# Patient Record
Sex: Male | Born: 1990 | Race: Black or African American | Hispanic: No | Marital: Single | Smoking: Current every day smoker
Health system: Southern US, Community
[De-identification: ages and names within clinical notes are randomized; demographics above are authoritative.]

## PROBLEM LIST (undated history)

## (undated) DIAGNOSIS — J45909 Unspecified asthma, uncomplicated: Secondary | ICD-10-CM

---
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
## 2015-03-25 ENCOUNTER — Encounter (HOSPITAL_COMMUNITY): Payer: Self-pay | Admitting: Emergency Medicine

## 2015-03-25 ENCOUNTER — Emergency Department (HOSPITAL_COMMUNITY)
Admission: EM | Admit: 2015-03-25 | Discharge: 2015-03-25 | Disposition: A | Discharge status: Home / Self Care | Payer: Self-pay | Attending: Emergency Medicine | Admitting: Emergency Medicine

## 2015-03-25 DIAGNOSIS — W228XXA Striking against or struck by other objects, initial encounter: Secondary | ICD-10-CM | POA: Insufficient documentation

## 2015-03-25 DIAGNOSIS — Y998 Other external cause status: Secondary | ICD-10-CM | POA: Insufficient documentation

## 2015-03-25 DIAGNOSIS — Y9289 Other specified places as the place of occurrence of the external cause: Secondary | ICD-10-CM | POA: Insufficient documentation

## 2015-03-25 DIAGNOSIS — Y9389 Activity, other specified: Secondary | ICD-10-CM | POA: Insufficient documentation

## 2015-03-25 DIAGNOSIS — Z720 Tobacco use: Secondary | ICD-10-CM | POA: Insufficient documentation

## 2015-03-25 DIAGNOSIS — S0502XA Injury of conjunctiva and corneal abrasion without foreign body, left eye, initial encounter: Secondary | ICD-10-CM | POA: Insufficient documentation

## 2015-03-25 MED ORDER — ERYTHROMYCIN 5 MG/GM OP OINT
TOPICAL_OINTMENT | Freq: Once | OPHTHALMIC | Status: AC
  Administered 2015-03-25: 1 via OPHTHALMIC
  Filled 2015-03-25: qty 3.5

## 2015-03-25 MED ORDER — TETRACAINE HCL 0.5 % OP SOLN
OPHTHALMIC | Status: AC
  Administered 2015-03-25: 22:00:00
  Filled 2015-03-25: qty 2

## 2015-03-25 MED ORDER — FLUORESCEIN SODIUM 1 MG OP STRP
ORAL_STRIP | OPHTHALMIC | Status: AC
  Administered 2015-03-25: 1
  Filled 2015-03-25: qty 1

## 2015-03-25 NOTE — ED Notes (Signed)
Patient states he scratched his eye with a ziptie around 1930.

## 2015-03-25 NOTE — Discharge Instructions (Signed)
Corneal Abrasion °The cornea is the clear covering at the front and center of the eye. When looking at the colored portion of the eye (iris), you are looking through the cornea. This very thin tissue is made up of many layers. The surface layer is a single layer of cells (corneal epithelium) and is one of the most sensitive tissues in the body. If a scratch or injury causes the corneal epithelium to come off, it is called a corneal abrasion. If the injury extends to the tissues below the epithelium, the condition is called a corneal ulcer. °CAUSES  °· Scratches. °· Trauma. °· Foreign body in the eye. °Some people have recurrences of abrasions in the area of the original injury even after it has healed (recurrent erosion syndrome). Recurrent erosion syndrome generally improves and goes away with time. °SYMPTOMS  °· Eye pain. °· Difficulty or inability to keep the injured eye open. °· The eye becomes very sensitive to light. °· Recurrent erosions tend to happen suddenly, first thing in the morning, usually after waking up and opening the eye. °DIAGNOSIS  °Your health care provider can diagnose a corneal abrasion during an eye exam. Dye is usually placed in the eye using a drop or a small paper strip moistened by your tears. When the eye is examined with a special light, the abrasion shows up clearly because of the dye. °TREATMENT  °· Small abrasions may be treated with antibiotic drops or ointment alone. °· A pressure patch may be put over the eye. If this is done, follow your doctor's instructions for when to remove the patch. Do not drive or use machines while the eye patch is on. Judging distances is hard to do with a patch on. °If the abrasion becomes infected and spreads to the deeper tissues of the cornea, a corneal ulcer can result. This is serious because it can cause corneal scarring. Corneal scars interfere with light passing through the cornea and cause a loss of vision in the involved eye. °HOME CARE  INSTRUCTIONS °· Use medicine or ointment as directed. Only take over-the-counter or prescription medicines for pain, discomfort, or fever as directed by your health care provider. °· Do not drive or operate machinery if your eye is patched. Your ability to judge distances is impaired. °· If your health care provider has given you a follow-up appointment, it is very important to keep that appointment. Not keeping the appointment could result in a severe eye infection or permanent loss of vision. If there is any problem keeping the appointment, let your health care provider know. °SEEK MEDICAL CARE IF:  °· You have pain, light sensitivity, and a scratchy feeling in one eye or both eyes. °· Your pressure patch keeps loosening up, and you can blink your eye under the patch after treatment. °· Any kind of discharge develops from the eye after treatment or if the lids stick together in the morning. °· You have the same symptoms in the morning as you did with the original abrasion days, weeks, or months after the abrasion healed. °MAKE SURE YOU:  °· Understand these instructions. °· Will watch your condition. °· Will get help right away if you are not doing well or get worse. °Document Released: 12/08/2000 Document Revised: 12/16/2013 Document Reviewed: 08/18/2013 °[employer]® Patient Information ©2015 [employer]. This information is not intended to replace advice given to you by your health care provider. Make sure you discuss any questions you have with your health care provider. ° °

## 2015-03-26 NOTE — ED Provider Notes (Signed)
CSN: [id number]     Arrival date & time 03/25/15  2125 History   First MD Initiated Contact with Patient 03/25/15 2147     Chief Complaint  Patient presents with  . Eye Injury     (Consider location/radiation/quality/duration/timing/severity/associated sxs/prior Treatment) The history is provided by the patient.   Tyler Cooper is a 24 y.o. male presenting with a painful left eye after injuring it with a scratch from a zip tie prior to arrival.  He denies visual changes, except for the tearing and difficulty keeping the eye open secondary to pain.  He flushed the eye prior to arrival without relief.  He does not wear glasses or contact lenses.  He is utd with his tetanus.     History reviewed. No pertinent past medical history. History reviewed. No pertinent past surgical history. No family history on file. History  Substance Use Topics  . Smoking status: Current Every Day Smoker  . Smokeless tobacco: Not on file  . Alcohol Use: Yes    Review of Systems  Constitutional: Negative.   HENT: Negative.   Eyes: Positive for pain. Negative for discharge and visual disturbance.  Respiratory: Negative.   Cardiovascular: Negative.   Gastrointestinal: Negative.       Allergies  Review of patient's allergies indicates no known allergies.  Home Medications   Prior to Admission medications   Not on File   BP 107/90 mmHg  Pulse 66  Temp(Src) 98.5 F (36.9 C) (Oral)  Resp 20  Ht 6\' 2"  (1.88 m)  Wt 185 lb (83.915 kg)  BMI 23.74 kg/m2  SpO2 100% Physical Exam  Constitutional: He appears well-developed and well-nourished.  HENT:  Head: Normocephalic and atraumatic.  Eyes: EOM are normal. Pupils are equal, round, and reactive to light. Left conjunctiva is injected.  Slit lamp exam:      The left eye shows corneal abrasion and fluorescein uptake. The left eye shows no foreign body and no anterior chamber bulge.  Small narrow abrasion middle of left cornea, approx 0.25 mm of  dye uptake.  Visual Acuity - Bilateral Near: 20/10 ; R Near: 20/13 ; L Near: 20/20  Neck: Normal range of motion.  Cardiovascular: Normal rate.   Pulmonary/Chest: Effort normal.  Neurological: He is alert.  Skin: Skin is warm and dry.  Psychiatric: He has a normal mood and affect.  Nursing note and vitals reviewed.   ED Course  Procedures (including critical care time) Labs Review Labs Reviewed - No data to display  Imaging Review No results found.   EKG Interpretation None      MDM   Final diagnoses:  Corneal abrasion, left, initial encounter    Pt given erythromycin ointment, first dose given here. Referral to Dr. Lita MainsHaines for a recheck if sx persist beyond the next 3 days, expect this small abrasion should be much improved by that time.      Burgess AmorJulie Teandre Hamre, PA-C 03/26/15 1426  Bethann BerkshireJoseph Zammit, MD 03/28/15 [phone]

## 2016-05-18 ENCOUNTER — Emergency Department (HOSPITAL_COMMUNITY): Payer: Self-pay

## 2016-05-18 ENCOUNTER — Encounter (HOSPITAL_COMMUNITY): Payer: Self-pay | Admitting: Emergency Medicine

## 2016-05-18 ENCOUNTER — Emergency Department (HOSPITAL_COMMUNITY)
Admission: EM | Admit: 2016-05-18 | Discharge: 2016-05-18 | Disposition: A | Discharge status: Home / Self Care | Payer: Self-pay | Attending: Emergency Medicine | Admitting: Emergency Medicine

## 2016-05-18 DIAGNOSIS — Z720 Tobacco use: Secondary | ICD-10-CM

## 2016-05-18 DIAGNOSIS — J069 Acute upper respiratory infection, unspecified: Secondary | ICD-10-CM | POA: Insufficient documentation

## 2016-05-18 DIAGNOSIS — J9801 Acute bronchospasm: Secondary | ICD-10-CM | POA: Insufficient documentation

## 2016-05-18 DIAGNOSIS — F17210 Nicotine dependence, cigarettes, uncomplicated: Secondary | ICD-10-CM | POA: Insufficient documentation

## 2016-05-18 HISTORY — DX: Unspecified asthma, uncomplicated: J45.909

## 2016-05-18 MED ORDER — GUAIFENESIN-CODEINE 100-10 MG/5ML PO SOLN: 5.0000 mL | Freq: Four times a day (QID) | ORAL | Status: DC | PRN

## 2016-05-18 MED ORDER — ALBUTEROL SULFATE HFA 108 (90 BASE) MCG/ACT IN AERS
2.0000 | INHALATION_SPRAY | Freq: Once | RESPIRATORY_TRACT | Status: AC
  Administered 2016-05-18: 2 via RESPIRATORY_TRACT
  Filled 2016-05-18: qty 6.7

## 2016-05-18 MED ORDER — PREDNISONE 20 MG PO TABS: 40.0000 mg | ORAL_TABLET | Freq: Every day | ORAL | Status: DC

## 2016-05-18 MED ORDER — AEROCHAMBER PLUS W/MASK MISC
1.0000 | Freq: Once | Status: DC
  Filled 2016-05-18: qty 1

## 2016-05-18 NOTE — ED Provider Notes (Signed)
CSN: [id number]     Arrival date & time 05/18/16  4098 History   None    Chief Complaint  Patient presents with  . Cough     (Consider location/radiation/quality/duration/timing/severity/associated sxs/prior Treatment) HPI This is a 25 year old male who presents emergency Department with chief complaint of cough, nasal congestion. The patient is a current daily smoker of both marijuana and cigarettes. He denies any history of asthma. The patient states that he developed chest congestion, cough. He has been using Mucinex with moderate relief of symptoms. He is having worse cough at night and has had difficulty sleeping. He has not taken any sleep aids or nighttime cough medicine. He denies fevers, chills, bodyaches. He has a negative chest x-ray. Upon initial evaluation Past Medical History  Diagnosis Date  . Asthma    History reviewed. No pertinent past surgical history. History reviewed. No pertinent family history. Social History  Substance Use Topics  . Smoking status: Current Every Day Smoker -- 1.00 packs/day    Types: Cigarettes  . Smokeless tobacco: None  . Alcohol Use: Yes    Review of Systems  Ten systems reviewed and are negative for acute change, except as noted in the HPI.    Allergies  Review of patient's allergies indicates no known allergies.  Home Medications   Prior to Admission medications   Not on File   BP 111/76 mmHg  Pulse 65  Temp(Src) 98.2 F (36.8 C) (Oral)  Resp 16  Ht  (1.905 m)  Wt 81.194 kg  BMI 22.37 kg/m2  SpO2 100% Physical Exam Physical Exam  Nursing note and vitals reviewed. Constitutional: He appears well-developed and well-nourished. No distress.  HENT:  Head: Normocephalic and atraumatic.  Eyes: Conjunctivae normal are normal. No scleral icterus.  Neck: Normal range of motion. Neck supple.  Cardiovascular: Normal rate, regular rhythm and normal heart sounds.   Pulmonary/Chest: Effort normal. No respiratory distress.   tight cough with minimal expiratory wheeze. Abdominal: Soft. There is no tenderness.  Musculoskeletal: He exhibits no edema.  Neurological: He is alert.  Skin: Skin is warm and dry. He is not diaphoretic.  Psychiatric: His behavior is normal.    ED Course  Procedures (including critical care time) Labs Review Labs Reviewed - No data to display  Imaging Review Dg Chest 2 View  05/18/2016  CLINICAL DATA:  Productive cough and congestion for 3 days, smoker, asthma, initial encounter EXAM: CHEST  2 VIEW COMPARISON:  None FINDINGS: Normal heart size, mediastinal contours, and pulmonary vascularity. Minimal peribronchial thickening and hyperinflation consistent with history of asthma. No acute infiltrate, pleural effusion, or pneumothorax. Bones unremarkable. IMPRESSION: Minimal peribronchial thickening and hyperinflation consistent with history of asthma. No acute infiltrate. Electronically Signed   By: Ulyses Southward M.D.   On: 05/18/2016 10:04   I have personally reviewed and evaluated these images and lab results as part of my medical decision-making.   EKG Interpretation None      MDM   Final diagnoses:  URI, acute  Bronchospasm  Tobacco abuse    Pt CXR negative for acute infiltrate. Patients symptoms are consistent with URI, likely viral etiology. Discussed that antibiotics are not indicated for viral infections. Pt will be discharged with symptomatic treatment.  Verbalizes understanding and is agreeable with plan. Pt is hemodynamically stable & in NAD prior to dc. The patient was counseled on the dangers of tobacco use, and was advised to quit.  Reviewed strategies to maximize success, including removing cigarettes and smoking materials  from environment, stress management, substitution of other forms of reinforcement, support of family/friends and written materials.     Arthor Captainbigail Tammy Ericsson, PA-C 05/18/16 1228  Donnetta HutchingBrian Cook, MD 05/18/16 1325

## 2016-05-18 NOTE — Discharge Instructions (Signed)
Bronchospasm, Adult A bronchospasm is a spasm or tightening of the airways going into the lungs. During a bronchospasm breathing becomes more difficult because the airways get smaller. When this happens there can be coughing, a whistling sound when breathing (wheezing), and difficulty breathing. Bronchospasm is often associated with asthma, but not all patients who experience a bronchospasm have asthma. CAUSES  A bronchospasm is caused by inflammation or irritation of the airways. The inflammation or irritation may be triggered by:   Allergies (such as to animals, pollen, food, or mold). Allergens that cause bronchospasm may cause wheezing immediately after exposure or many hours later.   Infection. Viral infections are believed to be the most common cause of bronchospasm.   Exercise.   Irritants (such as pollution, cigarette smoke, strong odors, aerosol sprays, and paint fumes).   Weather changes. Winds increase molds and pollens in the air. Rain refreshes the air by washing irritants out. Cold air may cause inflammation.   Stress and emotional upset.  SIGNS AND SYMPTOMS   Wheezing.   Excessive nighttime coughing.   Frequent or severe coughing with a simple cold.   Chest tightness.   Shortness of breath.  DIAGNOSIS  Bronchospasm is usually diagnosed through a history and physical exam. Tests, such as chest X-rays, are sometimes done to look for other conditions. TREATMENT   Inhaled medicines can be given to open up your airways and help you breathe. The medicines can be given using either an inhaler or a nebulizer machine.  Corticosteroid medicines may be given for severe bronchospasm, usually when it is associated with asthma. HOME CARE INSTRUCTIONS   Always have a plan prepared for seeking medical care. Know when to call your health care provider and local emergency services (911 in the [place] Know where you can access local emergency care.  Only take medicines as  directed by your health care provider.  If you were prescribed an inhaler or nebulizer machine, ask your health care provider to explain how to use it correctly. Always use a spacer with your inhaler if you were given one.  It is necessary to remain calm during an attack. Try to relax and breathe more slowly.  Control your home environment in the following ways:   Change your heating and air conditioning filter at least once a month.   Limit your use of fireplaces and wood stoves.  Do not smoke and do not allow smoking in your home.   Avoid exposure to perfumes and fragrances.   Get rid of pests (such as roaches and mice) and their droppings.   Throw away plants if you see mold on them.   Keep your house clean and dust free.   Replace carpet with wood, tile, or vinyl flooring. Carpet can trap dander and dust.   Use allergy-proof pillows, mattress covers, and box spring covers.   Wash bed sheets and blankets every week in hot water and dry them in a dryer.   Use blankets that are made of polyester or cotton.   Wash hands frequently. SEEK MEDICAL CARE IF:   You have muscle aches.   You have chest pain.   The sputum changes from clear or white to yellow, green, gray, or bloody.   The sputum you cough up gets thicker.   There are problems that may be related to the medicine you are given, such as a rash, itching, swelling, or trouble breathing.  SEEK IMMEDIATE MEDICAL CARE IF:   You have worsening wheezing and coughing  even after taking your prescribed medicines.   You have increased difficulty breathing.   You develop severe chest pain. MAKE SURE YOU:   Understand these instructions.  Will watch your condition.  Will get help right away if you are not doing well or get worse.   This information is not intended to replace advice given to you by your health care provider. Make sure you discuss any questions you have with your health care  provider.   Document Released: 12/14/2003 Document Revised: 01/01/2015 Document Reviewed: 06/02/2013 Elsevier Interactive Patient Education 2016 [employer]  Steps to Quit Smoking  Smoking tobacco can be harmful to your health and can affect almost every organ in your body. Smoking puts you, and those around you, at risk for developing many serious chronic diseases. Quitting smoking is difficult, but it is one of the best things that you can do for your health. It is never too late to quit. WHAT ARE THE BENEFITS OF QUITTING SMOKING? When you quit smoking, you lower your risk of developing serious diseases and conditions, such as:  Lung cancer or lung disease, such as COPD.  Heart disease.  Stroke.  Heart attack.  Infertility.  Osteoporosis and bone fractures. Additionally, symptoms such as coughing, wheezing, and shortness of breath may get better when you quit. You may also find that you get sick less often because your body is stronger at fighting off colds and infections. If you are pregnant, quitting smoking can help to reduce your chances of having a baby of low birth weight. HOW DO I GET READY TO QUIT? When you decide to quit smoking, create a plan to make sure that you are successful. Before you quit:  Pick a date to quit. Set a date within the next two weeks to give you time to prepare.  Write down the reasons why you are quitting. Keep this list in places where you will see it often, such as on your bathroom mirror or in your car or wallet.  Identify the people, places, things, and activities that make you want to smoke (triggers) and avoid them. Make sure to take these actions:  Throw away all cigarettes at home, at work, and in your car.  Throw away smoking accessories, such as Set designerashtrays and lighters.  Clean your car and make sure to empty the ashtray.  Clean your home, including curtains and carpets.  Tell your family, friends, and coworkers that you are quitting.  Support from your loved ones can make quitting easier.  Talk with your health care provider about your options for quitting smoking.  Find out what treatment options are covered by your health insurance. WHAT STRATEGIES CAN I USE TO QUIT SMOKING?  Talk with your healthcare provider about different strategies to quit smoking. Some strategies include:  Quitting smoking altogether instead of gradually lessening how much you smoke over a period of time. Research shows that quitting "cold [place]" is more successful than gradually quitting.  Attending in-person counseling to help you build problem-solving skills. You are more likely to have success in quitting if you attend several counseling sessions. Even short sessions of 10 minutes can be effective.  Finding resources and support systems that can help you to quit smoking and remain smoke-free after you quit. These resources are most helpful when you use them often. They can include:  Online chats with a Veterinary surgeoncounselor.  Telephone quitlines.  Printed Materials engineerself-help materials.  Support groups or group counseling.  Text messaging programs.  Mobile phone  applications.  Taking medicines to help you quit smoking. (If you are pregnant or breastfeeding, talk with your health care provider first.) Some medicines contain nicotine and some do not. Both types of medicines help with cravings, but the medicines that include nicotine help to relieve withdrawal symptoms. Your health care provider may recommend:  Nicotine patches, gum, or lozenges.  Nicotine inhalers or sprays.  Non-nicotine medicine that is taken by mouth. Talk with your health care provider about combining strategies, such as taking medicines while you are also receiving in-person counseling. Using these two strategies together makes you more likely to succeed in quitting than if you used either strategy on its own. If you are pregnant or breastfeeding, talk with your health care provider  about finding counseling or other support strategies to quit smoking. Do not take medicine to help you quit smoking unless told to do so by your health care provider. WHAT THINGS CAN I DO TO MAKE IT EASIER TO QUIT? Quitting smoking might feel overwhelming at first, but there is a lot that you can do to make it easier. Take these important actions:  Reach out to your family and friends and ask that they support and encourage you during this time. Call telephone quitlines, reach out to support groups, or work with a counselor for support.  Ask people who smoke to avoid smoking around you.  Avoid places that trigger you to smoke, such as bars, parties, or smoke-break areas at work.  Spend time around people who do not smoke.  Lessen stress in your life, because stress can be a smoking trigger for some people. To lessen stress, try:  Exercising regularly.  Deep-breathing exercises.  Yoga.  Meditating.  Performing a body scan. This involves closing your eyes, scanning your body from head to toe, and noticing which parts of your body are particularly tense. Purposefully relax the muscles in those areas.  Download or purchase mobile phone or tablet apps (applications) that can help you stick to your quit plan by providing reminders, tips, and encouragement. There are many free apps, such as QuitGuide from the [employer] developer for Disease Control and Prevention). You can find other support for quitting smoking (smoking cessation) through smokefree.gov and other websites. HOW WILL I FEEL WHEN I QUIT SMOKING? Within the first 24 hours of quitting smoking, you may start to feel some withdrawal symptoms. These symptoms are usually most noticeable 2-3 days after quitting, but they usually do not last beyond 2-3 weeks. Changes or symptoms that you might experience include:  Mood swings.  Restlessness, anxiety, or irritation.  Difficulty concentrating.  Dizziness.  Strong cravings for sugary foods  in addition to nicotine.  Mild weight gain.  Constipation.  Nausea.  Coughing or a sore throat.  Changes in how your medicines work in your body.  A depressed mood.  Difficulty sleeping (insomnia). After the first 2-3 weeks of quitting, you may start to notice more positive results, such as:  Improved sense of smell and taste.  Decreased coughing and sore throat.  Slower heart rate.  Lower blood pressure.  Clearer skin.  The ability to breathe more easily.  Fewer sick days. Quitting smoking is very challenging for most people. Do not get discouraged if you are not successful the first time. Some people need to make many attempts to quit before they achieve long-term success. Do your best to stick to your quit plan, and talk with your health care provider if you have any questions or concerns.  This information is not intended to replace advice given to you by your health care provider. Make sure you discuss any questions you have with your health care provider.   Document Released: 12/05/2001 Document Revised: 04/27/2015 Document Reviewed: 04/27/2015 Elsevier Interactive Patient Education 2016 Elsevier Inc.  Upper Respiratory Infection, Adult Most upper respiratory infections (URIs) are a viral infection of the air passages leading to the lungs. A URI affects the nose, throat, and upper air passages. The most common type of URI is nasopharyngitis and is typically referred to as "the common cold." URIs run their course and usually go away on their own. Most of the time, a URI does not require medical attention, but sometimes a bacterial infection in the upper airways can follow a viral infection. This is called a secondary infection. Sinus and middle ear infections are common types of secondary upper respiratory infections. Bacterial pneumonia can also complicate a URI. A URI can worsen asthma and chronic obstructive pulmonary disease (COPD). Sometimes, these complications  can require emergency medical care and may be life threatening.  CAUSES Almost all URIs are caused by viruses. A virus is a type of germ and can spread from one person to another.  RISKS FACTORS You may be at risk for a URI if:   You smoke.   You have chronic heart or lung disease.  You have a weakened defense (immune) system.   You are very young or very old.   You have nasal allergies or asthma.  You work in crowded or poorly ventilated areas.  You work in health care facilities or schools. SIGNS AND SYMPTOMS  Symptoms typically develop 2-3 days after you come in contact with a cold virus. Most viral URIs last 7-10 days. However, viral URIs from the influenza virus (flu virus) can last 14-18 days and are typically more severe. Symptoms may include:   Runny or stuffy (congested) nose.   Sneezing.   Cough.   Sore throat.   Headache.   Fatigue.   Fever.   Loss of appetite.   Pain in your forehead, behind your eyes, and over your cheekbones (sinus pain).  Muscle aches.  DIAGNOSIS  Your health care provider may diagnose a URI by:  Physical exam.  Tests to check that your symptoms are not due to another condition such as:  Strep throat.  Sinusitis.  Pneumonia.  Asthma. TREATMENT  A URI goes away on its own with time. It cannot be cured with medicines, but medicines may be prescribed or recommended to relieve symptoms. Medicines may help:  Reduce your fever.  Reduce your cough.  Relieve nasal congestion. HOME CARE INSTRUCTIONS   Take medicines only as directed by your health care provider.   Gargle warm saltwater or take cough drops to comfort your throat as directed by your health care provider.  Use a warm mist humidifier or inhale steam from a shower to increase air moisture. This may make it easier to breathe.  Drink enough fluid to keep your urine clear or pale yellow.   Eat soups and other clear broths and maintain good  nutrition.   Rest as needed.   Return to work when your temperature has returned to normal or as your health care provider advises. You may need to stay home longer to avoid infecting others. You can also use a face mask and careful hand washing to prevent spread of the virus.  Increase the usage of your inhaler if you have asthma.   Do not  use any tobacco products, including cigarettes, chewing tobacco, or electronic cigarettes. If you need help quitting, ask your health care provider. PREVENTION  The best way to protect yourself from getting a cold is to practice good hygiene.   Avoid oral or hand contact with people with cold symptoms.   Wash your hands often if contact occurs.  There is no clear evidence that vitamin C, vitamin E, echinacea, or exercise reduces the chance of developing a cold. However, it is always recommended to get plenty of rest, exercise, and practice good nutrition.  SEEK MEDICAL CARE IF:   You are getting worse rather than better.   Your symptoms are not controlled by medicine.   You have chills.  You have worsening shortness of breath.  You have brown or red mucus.  You have yellow or brown nasal discharge.  You have pain in your face, especially when you bend forward.  You have a fever.  You have swollen neck glands.  You have pain while swallowing.  You have white areas in the back of your throat. SEEK IMMEDIATE MEDICAL CARE IF:   You have severe or persistent:  Headache.  Ear pain.  Sinus pain.  Chest pain.  You have chronic lung disease and any of the following:  Wheezing.  Prolonged cough.  Coughing up blood.  A change in your usual mucus.  You have a stiff neck.  You have changes in your:  Vision.  Hearing.  Thinking.  Mood. MAKE SURE YOU:   Understand these instructions.  Will watch your condition.  Will get help right away if you are not doing well or get worse.   This information is not  intended to replace advice given to you by your health care provider. Make sure you discuss any questions you have with your health care provider.   Document Released: 06/06/2001 Document Revised: 04/27/2015 Document Reviewed: 03/18/2014 Elsevier Interactive Patient Education [employer]

## 2016-05-18 NOTE — ED Notes (Signed)
Pt states he has had a lot of chest congestion developing over the past few days with a cough.  States not much comes up but is yellow.

## 2017-10-26 ENCOUNTER — Encounter (HOSPITAL_COMMUNITY): Payer: Self-pay | Admitting: *Deleted

## 2017-10-26 ENCOUNTER — Emergency Department (HOSPITAL_COMMUNITY): Payer: Self-pay

## 2017-10-26 ENCOUNTER — Emergency Department (HOSPITAL_COMMUNITY)
Admission: EM | Admit: 2017-10-26 | Discharge: 2017-10-27 | Disposition: A | Discharge status: Home / Self Care | Payer: Self-pay | Attending: Emergency Medicine | Admitting: Emergency Medicine

## 2017-10-26 DIAGNOSIS — J45909 Unspecified asthma, uncomplicated: Secondary | ICD-10-CM | POA: Insufficient documentation

## 2017-10-26 DIAGNOSIS — S161XXA Strain of muscle, fascia and tendon at neck level, initial encounter: Secondary | ICD-10-CM | POA: Insufficient documentation

## 2017-10-26 DIAGNOSIS — F17210 Nicotine dependence, cigarettes, uncomplicated: Secondary | ICD-10-CM | POA: Insufficient documentation

## 2017-10-26 DIAGNOSIS — S060X9A Concussion with loss of consciousness of unspecified duration, initial encounter: Secondary | ICD-10-CM | POA: Insufficient documentation

## 2017-10-26 DIAGNOSIS — F1019 Alcohol abuse with unspecified alcohol-induced disorder: Secondary | ICD-10-CM | POA: Insufficient documentation

## 2017-10-26 DIAGNOSIS — S0993XA Unspecified injury of face, initial encounter: Secondary | ICD-10-CM | POA: Insufficient documentation

## 2017-10-26 DIAGNOSIS — Y999 Unspecified external cause status: Secondary | ICD-10-CM | POA: Insufficient documentation

## 2017-10-26 DIAGNOSIS — R4182 Altered mental status, unspecified: Secondary | ICD-10-CM | POA: Insufficient documentation

## 2017-10-26 DIAGNOSIS — Y9389 Activity, other specified: Secondary | ICD-10-CM | POA: Insufficient documentation

## 2017-10-26 DIAGNOSIS — F1010 Alcohol abuse, uncomplicated: Secondary | ICD-10-CM

## 2017-10-26 DIAGNOSIS — Y929 Unspecified place or not applicable: Secondary | ICD-10-CM | POA: Insufficient documentation

## 2017-10-26 DIAGNOSIS — R600 Localized edema: Secondary | ICD-10-CM | POA: Insufficient documentation

## 2017-10-26 MED ORDER — AMMONIA AROMATIC IN INHA
RESPIRATORY_TRACT | Status: AC
  Administered 2017-10-26
  Filled 2017-10-26: qty 10
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
## 2017-10-26 NOTE — ED Notes (Signed)
Pt applied to cardiac monitor.

## 2017-10-26 NOTE — ED Triage Notes (Signed)
Pt is here with 3 police officers requesting med clearance for pt
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
## 2017-10-26 NOTE — ED Provider Notes (Signed)
MSE was initiated and I personally evaluated the patient and placed orders (if any) at  11:02 PM on October 26, 2017.  The patient appears stable so that the remainder of the MSE may be completed by another provider.  Called to the bedside because of possible seizure activity.  Pt appears to be having fine tremors in his chest wall.  Appears as if pt is shaking, chilled.  Doubt seizure activity.     Linwood DibblesKnapp, Brittany Amirault, MD 10/27/17 1336

## 2017-10-27 LAB — CBC
HCT: 45.8 % (ref 39.0–52.0)
Hemoglobin: 15.5 g/dL (ref 13.0–17.0)
MCH: 31 pg (ref 26.0–34.0)
MCHC: 33.8 g/dL (ref 30.0–36.0)
MCV: 91.6 fL (ref 78.0–100.0)
Platelets: 155 10*3/uL (ref 150–400)
RBC: 5 MIL/uL (ref 4.22–5.81)
RDW: 12.8 % (ref 11.5–15.5)
WBC: 18.2 10*3/uL — ABNORMAL HIGH (ref 4.0–10.5)

## 2017-10-27 LAB — COMPREHENSIVE METABOLIC PANEL
ALT: 41 U/L (ref 17–63)
ANION GAP: 10 (ref 5–15)
AST: 68 U/L — ABNORMAL HIGH (ref 15–41)
Albumin: 4.6 g/dL (ref 3.5–5.0)
Alkaline Phosphatase: 44 U/L (ref 38–126)
BUN: 13 mg/dL (ref 6–20)
CO2: 22 mmol/L (ref 22–32)
Calcium: 8.6 mg/dL — ABNORMAL LOW (ref 8.9–10.3)
Chloride: 106 mmol/L (ref 101–111)
Creatinine, Ser: 1.29 mg/dL — ABNORMAL HIGH (ref 0.61–1.24)
GFR calc Af Amer: >60 mL/min (ref >60)
GFR calc non Af Amer: >60 mL/min (ref >60)
Glucose, Bld: 99 mg/dL (ref 65–99)
Potassium: 3.1 mmol/L — ABNORMAL LOW (ref 3.5–5.1)
SODIUM: 138 mmol/L (ref 135–145)
Total Bilirubin: 0.6 mg/dL (ref 0.3–1.2)
Total Protein: 7 g/dL (ref 6.5–8.1)

## 2017-10-27 LAB — RAPID URINE DRUG SCREEN, HOSP PERFORMED
AMPHETAMINES: NOT DETECTED
BENZODIAZEPINES: NOT DETECTED
Barbiturates: NOT DETECTED
Cocaine: NOT DETECTED
OPIATES: NOT DETECTED
Tetrahydrocannabinol: POSITIVE — AB

## 2017-10-27 LAB — CBG MONITORING, ED: GLUCOSE-CAPILLARY: 97 mg/dL (ref 65–99)

## 2017-10-27 LAB — PROTIME-INR
INR: 1
Prothrombin Time: 13.1 seconds (ref 11.4–15.2)

## 2017-10-27 LAB — SAMPLE TO BLOOD BANK

## 2017-10-27 LAB — ETHANOL: ALCOHOL ETHYL (B): 312 mg/dL — AB (ref <10)

## 2017-10-27 MED ORDER — AMMONIA AROMATIC IN INHA
RESPIRATORY_TRACT | Status: AC
  Administered 2017-10-27: 1 mL
  Filled 2017-10-27: qty 10

## 2017-10-27 NOTE — ED Notes (Signed)
CRITICAL VALUE ALERT  Critical Value:  Etoh 312  Date & Time Notied:  10/27/2017 2347  Provider Notified: Dr. Bebe ShaggyWickline  Orders Received/Actions taken:
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
## 2019-01-24 IMAGING — DX DG CHEST 1V
1 series · 1 of 1 positions shown · non-contrast
Comparison: 05/18/2016

CLINICAL DATA: Pain.

EXAM:
CHEST 1 VIEW

[chest ap]
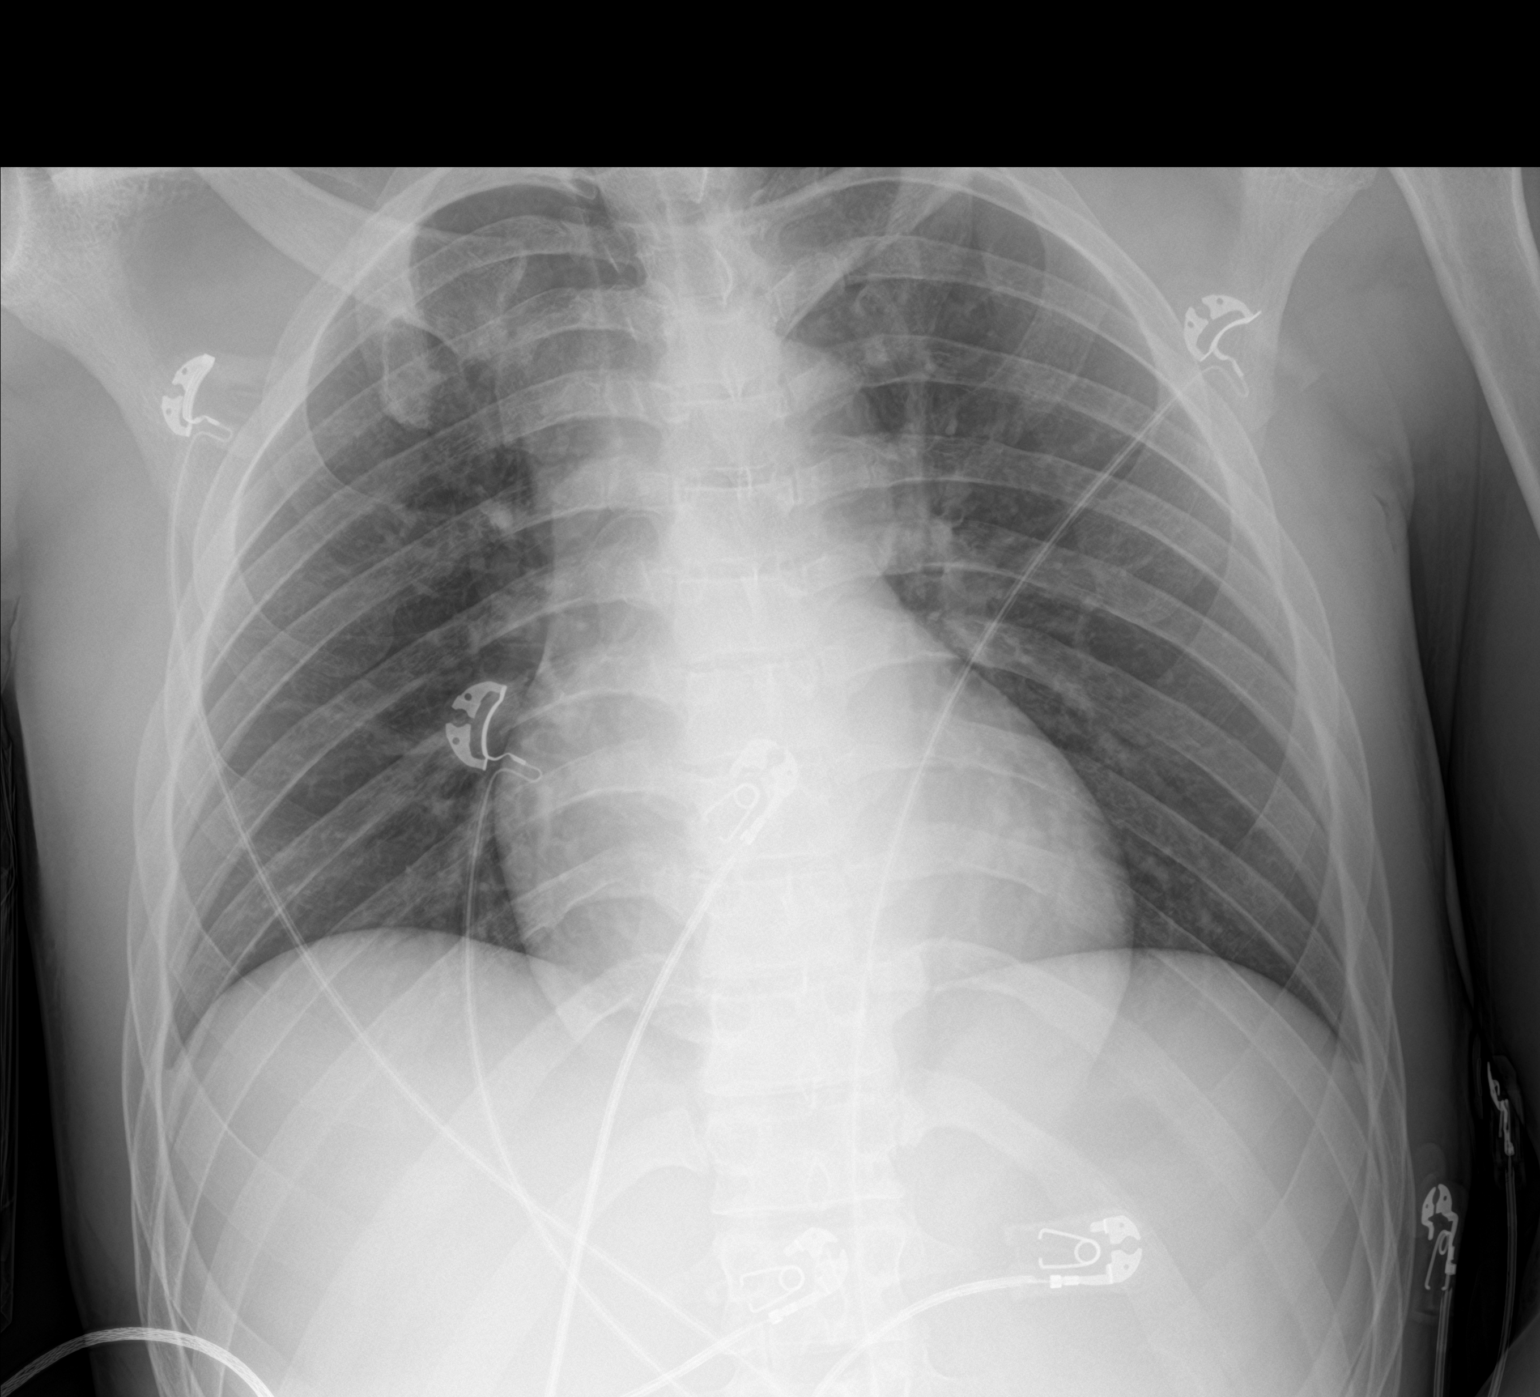

[1 of 1 positions shown; findings below may reference images not displayed]

FINDINGS: Lower lung volumes from prior exam accentuating cardiac size.
Mediastinal contours are normal. Pulmonary vasculature is normal for
technique. No consolidation, pleural effusion, or pneumothorax. No
acute osseous abnormalities are seen.
IMPRESSION: Hypoventilatory chest without evidence of acute abnormality.
